# Patient Record
Sex: Female | Born: 1990 | Race: Black or African American | Hispanic: No | Marital: Single
Health system: Midwestern US, Community
[De-identification: ages and names within clinical notes are randomized; demographics above are authoritative.]

---
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
## 2012-07-01 NOTE — ED Notes (Signed)
Pt denies wanting forensics now; states that she just wants condom removed and to go home
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
## 2012-07-01 NOTE — ED Notes (Signed)
Betsy from forensics coming to see pt
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
## 2014-05-12 ENCOUNTER — Inpatient Hospital Stay
Admit: 2014-05-12 | Discharge: 2014-05-12 | Disposition: A | Discharge status: Home / Self Care | Payer: Self-pay | Attending: Emergency Medicine

## 2014-05-12 DIAGNOSIS — S46911A Strain of unspecified muscle, fascia and tendon at shoulder and upper arm level, right arm, initial encounter: Secondary | ICD-10-CM

## 2014-05-12 MED ORDER — CYCLOBENZAPRINE 10 MG TAB
10 mg | ORAL_TABLET | Freq: Three times a day (TID) | ORAL | Status: DC | PRN
Start: 2014-05-12 — End: 2014-12-11
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
## 2014-05-12 NOTE — ED Notes (Signed)
Discharge instructions given to patient, patient verbalizes understanding of instructions. Patient alert and oriented x3, denies pain or shortness of breath at this time. Patient ambulatory, gait steady.   Patient armband removed and given to patient to take home.  Patient was informed of the privacy risks if armband lost or stolen.

## 2014-11-25 DIAGNOSIS — Z0441 Encounter for examination and observation following alleged adult rape: Secondary | ICD-10-CM
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
## 2014-11-25 NOTE — ED Notes (Signed)
[place] [employer] in, visiting with patient.
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
## 2014-11-26 ENCOUNTER — Inpatient Hospital Stay
Admit: 2014-11-26 | Discharge: 2014-11-26 | Discharge status: Against Medical Advice | Payer: Self-pay | Attending: Emergency Medicine

## 2014-11-26 MED ORDER — ACETAMINOPHEN 500 MG TAB
500 mg | ORAL | Status: AC
Start: 2014-11-26 — End: 2014-11-25
  Administered 2014-11-26: 04:00:00 via ORAL

## 2014-11-26 MED FILL — MAPAP EXTRA STRENGTH 500 MG TABLET: 500 mg | ORAL | Qty: 2

## 2014-11-26 NOTE — ED Notes (Signed)
Patient requested testing for STD's. However, pending work up for SANE to be process. Reminded patient, signed refusal for SANE kit processing. Corey Skains, PA. Discussed current patient. Patient refuses intervention. Discussed re: Against Medical Advice. Prepared refusal form, however patient left, without discharge instructions. Glendell Docker, [place] and Dr Chrisandra Carota aware.

## 2014-12-11 ENCOUNTER — Inpatient Hospital Stay
Admit: 2014-12-11 | Discharge: 2014-12-11 | Disposition: A | Discharge status: Home / Self Care | Payer: Self-pay | Attending: Emergency Medicine

## 2014-12-11 ENCOUNTER — Emergency Department: Admit: 2014-12-11 | Payer: Self-pay

## 2014-12-11 DIAGNOSIS — S60222A Contusion of left hand, initial encounter: Secondary | ICD-10-CM

## 2014-12-11 MED ORDER — HYDROCODONE-ACETAMINOPHEN 5 MG-325 MG TAB
5-325 mg | ORAL_TABLET | ORAL | 0 refills | Status: DC
Start: 2014-12-11 — End: 2014-12-12

## 2014-12-11 MED ORDER — IBUPROFEN 800 MG TAB
800 mg | ORAL_TABLET | Freq: Three times a day (TID) | ORAL | 0 refills | Status: AC
Start: 2014-12-11 — End: 2014-12-16

## 2014-12-11 NOTE — ED Triage Notes (Signed)
Pt states she fell in the [employer] parking lot.  Pt reports left hand pain

## 2014-12-11 NOTE — ED Notes (Signed)
I have reviewed discharge instructions with the patient.  The patient verbalized understanding. Patient armband removed and shredded. Patient discharged ambulatory to lobby with friend.
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
## 2014-12-12 ENCOUNTER — Emergency Department: Admit: 2014-12-13 | Payer: Self-pay

## 2014-12-12 ENCOUNTER — Inpatient Hospital Stay
Admit: 2014-12-12 | Discharge: 2014-12-13 | Disposition: A | Discharge status: Home / Self Care | Payer: Self-pay | Attending: Emergency Medicine

## 2014-12-12 DIAGNOSIS — S40012A Contusion of left shoulder, initial encounter: Secondary | ICD-10-CM

## 2014-12-12 NOTE — ED Notes (Signed)
I have reviewed discharge instructions with the patient.  The patient verbalized understanding.

## 2014-12-12 NOTE — ED Provider Notes (Signed)
HPI Comments: 7:55 PM Sharon Davidson is a 24 y.o. female with a history of HTN and stroke who presents to the ED with chief complaint of left ankle pain onset 2 days ago after she fell onto concrete. Patient reports difficulty ambulating secondary to pain. She also complains of pain to her right knee and left shoulder. Patient is allergic to sulfa drugs. She denies LOC, head injury, neck pain or possibility of pregnancy. Tetanus not UTD. No other concerns at this time.          The history is provided by the patient.        Past Medical History:   Diagnosis Date   ??? Hypertension    ??? Stroke [facility])        History reviewed. No pertinent past surgical history.      History reviewed. No pertinent family history.    Social History     Social History   ??? Marital status: SINGLE     Spouse name: N/A   ??? Number of children: N/A   ??? Years of education: N/A     Occupational History   ??? Not on file.     Social History Main Topics   ??? Smoking status: Current Every Day Smoker   ??? Smokeless tobacco: Not on file   ??? Alcohol use Yes      Comment: oocasionally   ??? Drug use: Yes     Special: Marijuana      Comment: x2 weekly   ??? Sexual activity: Not on file     Other Topics Concern   ??? Not on file     Social History Narrative         ALLERGIES: Sulfa (sulfonamide antibiotics)    Review of Systems   Constitutional: Negative for chills and fever.   HENT: Positive for congestion. Negative for sore throat.    Eyes: Negative for redness.   Respiratory: Negative for shortness of breath and wheezing.    Gastrointestinal: Negative for abdominal pain and nausea.   Genitourinary: Negative for dysuria.   Musculoskeletal: Positive for arthralgias (left ankle; right knee; left shoulder). Negative for neck stiffness.   Skin: Negative for pallor.   Neurological: Negative for syncope and headaches.   Hematological: Does not bruise/bleed easily.   All other systems reviewed and are negative.      Vitals:    12/12/14 1950   BP: 151/83   Pulse: 61    Resp: 18   Temp: 98.6 ??F (37 ??C)   SpO2: 100%            Physical Exam   Constitutional: She is oriented to person, place, and time. She appears well-developed and well-nourished. No distress.   HENT:   Head: Normocephalic and atraumatic.   Mouth/Throat: Oropharynx is clear and moist.   Eyes: Conjunctivae are normal. Pupils are equal, round, and reactive to light. No scleral icterus.   Neck: Normal range of motion. Neck supple.   Cardiovascular: Intact distal pulses.    Pulses:       Dorsalis pedis pulses are 2+ on the right side, and 2+ on the left side.   Capillary refill < 3 seconds   Pulmonary/Chest: Effort normal and breath sounds normal. No respiratory distress. She has no wheezes.   Abdominal: Soft. Bowel sounds are normal. She exhibits no distension. There is no tenderness.   Musculoskeletal: Normal range of motion. She exhibits no edema.   Lateral tenderness to the dorsum of the left  foot, close to the ankle  No knee, tibia, or fibula tenderness on the left  Limping on left foot  No right knee tenderness  No swelling or redness of lower extremity   Lymphadenopathy:     She has no cervical adenopathy.   Neurological: She is alert and oriented to person, place, and time. No cranial nerve deficit.   Skin: Skin is warm and dry. Abrasion (right knee) noted. She is not diaphoretic.   Nursing note and vitals reviewed.       MDM  Number of Diagnoses or Management Options  Abrasion:   Contusion of left shoulder, initial encounter:   Foot sprain, left, initial encounter:   Diagnosis management comments: ddx fx, contusion, sprain, dislocation    Xrays, analgesia, tdap    Pre and post ace wrap done by RN: nv intact       Amount and/or Complexity of Data Reviewed  Tests in the radiology section of CPT??: ordered and reviewed  Tests in the medicine section of CPT??: ordered and reviewed    Patient Progress  Patient progress: stable    ED Course       Procedures    Vitals:  Patient Vitals for the past 12 hrs:    Temp Pulse Resp BP SpO2   12/12/14 1950 98.6 ??F (37 ??C) 61 18 151/83 100 %       Lab findings:  Recent Results (from the past 12 hour(s))   HCG URINE, QL. - POC    Collection Time: 12/12/14  8:40 PM   Result Value Ref Range    Pregnancy test,urine (POC) NEGATIVE  NEG         X-Ray, CT or other radiology findings or impressions:  XR SHOULDER LT AP/LAT MIN 2 V   Preliminary Result  No fracture or dislocation interpreted by Ewing SchleinAdrian C Almetta Liddicoat, DO at 9:07 PM    XR FOOT LT MIN 3 V    Preliminary Result   No fracture or dislocation interpreted by Ewing SchleinAdrian C Lamarius Dirr, DO at 9:07 PM     Progress notes, Consult notes or additional Procedure notes:   9:08 PM Reassessed Pt. Reviewed all results with pt and pt agrees with plan for discharge and follow up. All questions answered at this time. Pt voices understanding.    Disposition:  Diagnosis:   1. Contusion of left shoulder, initial encounter    2. Foot sprain, left, initial encounter    3. Abrasion        Disposition: Discharged    Follow-up Information     Follow up With Details Comments Contact Info    [facility] EMERGENCY DEPT Go to As needed, If symptoms worsen [address]  [place] [id number]  [phone]    Kimberlee NearingPaul Warren, MD Schedule an appointment as soon as possible for a visit in 2 days ED visit follow up [address]  [address]  [facility]  [place] [phone]  [phone]      [facility] Schedule an appointment as soon as possible for a visit in 2 days ED visit follow up [address]  [place] [phone]  [phone]    [facility] Call in 1 day ED visit follow up [address]  [place] [phone]  [phone]           Patient's Medications   Start Taking    ACETAMINOPHEN-CODEINE (TYLENOL-CODEINE #3) 300-30 MG PER TABLET    Take  1 Tab by mouth every four (4) hours as needed for Pain. Max Daily Amount: 6 Tabs.   Continue Taking     IBUPROFEN (MOTRIN) 800 MG TABLET    Take 1 Tab by mouth every eight (8) hours for 5 days.   These Medications have changed    No medications on file   Stop Taking    HYDROCODONE-ACETAMINOPHEN (NORCO) 5-325 MG PER TABLET    Take 1-2 tablets PO every 4-6 hours as needed for pain control.  If over the counter ibuprofen or acetaminophen was suggested, then only take the vicodin for pain not well controlled with the over the counter medication.         Scribe Attestation  Regina Eck scribing for and in the presence of Ewing Schlein, DO (12/12/14 / 7:57 PM)      Physician Attestation    I personally performed the services described in this documentation, reviewed and edited the documentation which was dictated to the scribe in my presence, and it accurately records my words and actions.    Ewing Schlein, DO
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
## 2014-12-13 LAB — HCG URINE, QL. - POC: Pregnancy test,urine (POC): NEGATIVE

## 2014-12-13 MED ORDER — DIPHTH,PERTUS(AC)TETANUS VAC(PF) 2.5 LF UNIT-8 MCG-5 LF/0.5 ML INJ
INTRAMUSCULAR | Status: AC
Start: 2014-12-13 — End: 2014-12-12
  Administered 2014-12-13: 02:00:00 via INTRAMUSCULAR

## 2014-12-13 MED ORDER — ACETAMINOPHEN-CODEINE 300 MG-30 MG TAB
300-30 mg | ORAL_TABLET | ORAL | 0 refills | Status: DC | PRN
Start: 2014-12-13 — End: 2015-05-12

## 2014-12-13 MED ORDER — ACETAMINOPHEN-CODEINE 300 MG-30 MG TAB
300-30 mg | ORAL | Status: AC
Start: 2014-12-13 — End: 2014-12-12
  Administered 2014-12-13: 01:00:00 via ORAL

## 2014-12-13 MED FILL — BOOSTRIX TDAP 2.5 LF UNIT-8 MCG-5 LF/0.5 ML INTRAMUSCULAR SYRINGE: INTRAMUSCULAR | Qty: 0.5

## 2014-12-13 MED FILL — ACETAMINOPHEN-CODEINE 300 MG-30 MG TAB: 300-30 mg | ORAL | Qty: 2

## 2014-12-13 MED FILL — BOOSTRIX TDAP 2.5 LF UNIT-8 MCG-5 LF/0.5 ML INTRAMUSCULAR SYRINGE: INTRAMUSCULAR | Qty: 1

## 2015-05-12 DIAGNOSIS — K047 Periapical abscess without sinus: Secondary | ICD-10-CM

## 2015-05-12 NOTE — ED Triage Notes (Signed)
Pt reports R sided dental and facial pain x 1 week.

## 2015-05-12 NOTE — ED Notes (Signed)
I have reviewed discharge instructions and medications with the patient. The patient verbalized understanding.Patient is stable and in good condition with normal vital signs. Patient chose to discharge with their wristband; privacy risks were discussed. Patient escorted to lobby.

## 2015-05-12 NOTE — ED Provider Notes (Signed)
HPI Comments: 7:57 PM Sharon Davidson is a 25 y.o. female with h/o HTN and stroke who presents to ED complaining of lower right dental pain. Pt states she is taking Ibuprofen for the pain. Pt denies Penicillin allergy. No other concerns or symptoms at this time.    PCP: None    The history is provided by the patient.        Past Medical History:   Diagnosis Date   ??? Hypertension    ??? Stroke [facility])        History reviewed. No pertinent past surgical history.      History reviewed. No pertinent family history.    Social History     Social History   ??? Marital status: SINGLE     Spouse name: N/A   ??? Number of children: N/A   ??? Years of education: N/A     Occupational History   ??? Not on file.     Social History Main Topics   ??? Smoking status: Current Every Day Smoker   ??? Smokeless tobacco: Not on file   ??? Alcohol use Yes      Comment: oocasionally   ??? Drug use: Yes     Special: Marijuana      Comment: x2 weekly   ??? Sexual activity: Not on file     Other Topics Concern   ??? Not on file     Social History Narrative         ALLERGIES: Sulfa (sulfonamide antibiotics)    Review of Systems   Constitutional: Negative for chills and fever.   HENT: Positive for dental problem. Negative for sore throat.    Respiratory: Negative for shortness of breath.    Cardiovascular: Negative for chest pain.   Gastrointestinal: Negative for diarrhea and vomiting.   Skin: Negative for rash.   Neurological: Negative for headaches.   All other systems reviewed and are negative.      Vitals:    05/12/15 1958   BP: 117/54   Pulse: 66   Resp: 16   Temp: 98.3 ??F (36.8 ??C)   SpO2: 100%            Physical Exam   Constitutional: She is oriented to person, place, and time. She appears well-developed and well-nourished. No distress.   HENT:   Head: Normocephalic and atraumatic.   Deep decay to tooth # 31 w/ abscess at base of tooth.   Eyes: No scleral icterus.   Cardiovascular: Normal rate.    Pulmonary/Chest: Effort normal.    Neurological: She is alert and oriented to person, place, and time.   Skin: Skin is warm and dry.   Psychiatric: She has a normal mood and affect.   Nursing note and vitals reviewed.       MDM  Number of Diagnoses or Management Options  Dental abscess:   Dental decay:   Diagnosis management comments: IMP: Dental decay/abscess    ED Course       Procedures    Vitals:  Patient Vitals for the past 12 hrs:   Temp Pulse Resp BP SpO2   05/12/15 1958 98.3 ??F (36.8 ??C) 66 16 117/54 100 %         Medications ordered:   Medications - No data to display      Lab findings:  No results found for this or any previous visit (from the past 12 hour(s)).      Orders:  Orders Placed This Encounter   ???  penicillin v potassium (VEETID) 500 mg tablet     Sig: Take 1 Tab by mouth three (3) times daily for 10 days.     Dispense:  30 Tab     Refill:  0   ??? HYDROcodone-acetaminophen (NORCO) 5-325 mg per tablet     Sig: Take 1 Tab by mouth every four (4) hours as needed for Pain. Max Daily Amount: 6 Tabs.     Dispense:  12 Tab     Refill:  0   ??? ibuprofen (MOTRIN) 600 mg tablet     Sig: Take 1 Tab by mouth every six (6) hours as needed for Pain.     Dispense:  20 Tab     Refill:  0       Progress notes, Consult notes, or additional Procedure notes:   8:14 PM I have reassessed the patient and discussed their results and diagnosis. Pt will be discharged in stable condition. Patient is to return to emergency department if any new or worsening condition. Patient understands and verbalizes agreement with plan.    Disposition:  Diagnosis:   1. Dental abscess    2. Dental [facility] [phone]  [facility] [phone]  [facility] [phone]  [facility] [phone]  [facility] [phone]    1) Penicillin  2) Ibuprofen  3) Norco for severe pain  4) Salt water gargles  5) Dental follow up ASAP  6) Return for medical emergencies as needed      Disposition: Discharged home    Follow-up Information      Follow up With Details Comments Contact Info    Follow up with one of the dental clinics provided Schedule an appointment as soon as possible for a visit  [facility] [phone]  [facility] [phone]  [facility] [phone]  [facility] [phone]  [facility] [phone]  [facility] [phone]    [facility] EMERGENCY DEPT Go to As needed, If symptoms worsen [address]  [place] [address]  [phone]           Patient's Medications   Start Taking    HYDROCODONE-ACETAMINOPHEN (NORCO) 5-325 MG PER TABLET    Take 1 Tab by mouth every four (4) hours as needed for Pain. Max Daily Amount: 6 Tabs.    IBUPROFEN (MOTRIN) 600 MG TABLET    Take 1 Tab by mouth every six (6) hours as needed for Pain.    PENICILLIN V POTASSIUM (VEETID) 500 MG TABLET    Take 1 Tab by mouth three (3) times daily for 10 days.   Continue Taking    No medications on file   These Medications have changed    No medications on file   Stop Taking    ACETAMINOPHEN-CODEINE (TYLENOL-CODEINE #3) 300-30 MG PER TABLET    Take 1 Tab by mouth every four (4) hours as needed for Pain. Max Daily Amount: 6 Tabs.       Scribe Attestation  [employer] scribing for and in the presence of Karma Ganja, MD (05/12/15)      Physician Attestation  I personally performed the services described in this documentation, reviewed and edited the documentation which was dictated to the scribe in my presence, and it accurately records my words and actions.    Karma Ganja, MD (05/12/15)      Signed by: Dossie Arbour, Scribe, 05/12/15, 7:58 PM

## 2015-05-13 ENCOUNTER — Inpatient Hospital Stay
Admit: 2015-05-13 | Discharge: 2015-05-13 | Disposition: A | Discharge status: Home / Self Care | Payer: Self-pay | Attending: Emergency Medicine

## 2015-05-13 MED ORDER — IBUPROFEN 600 MG TAB
600 mg | ORAL_TABLET | Freq: Four times a day (QID) | ORAL | 0 refills | Status: DC | PRN
Start: 2015-05-13 — End: 2017-04-17

## 2015-05-13 MED ORDER — HYDROCODONE-ACETAMINOPHEN 5 MG-325 MG TAB
5-325 mg | ORAL_TABLET | ORAL | 0 refills | Status: DC | PRN
Start: 2015-05-13 — End: 2017-04-17

## 2015-05-13 MED ORDER — PENICILLIN V-K 500 MG TAB
500 mg | ORAL_TABLET | Freq: Three times a day (TID) | ORAL | 0 refills | Status: AC
Start: 2015-05-13 — End: 2015-05-22

## 2017-04-17 ENCOUNTER — Inpatient Hospital Stay
Admit: 2017-04-17 | Discharge: 2017-04-17 | Disposition: A | Discharge status: Home / Self Care | Payer: Self-pay | Attending: Emergency Medicine

## 2017-04-17 DIAGNOSIS — K029 Dental caries, unspecified: Secondary | ICD-10-CM

## 2017-04-17 MED ORDER — CLINDAMYCIN 300 MG CAP
300 mg | ORAL_CAPSULE | Freq: Three times a day (TID) | ORAL | 0 refills | Status: AC
Start: 2017-04-17 — End: 2017-04-24

## 2017-04-17 MED ORDER — HYDROCODONE-ACETAMINOPHEN 5 MG-325 MG TAB
5-325 mg | ORAL_TABLET | Freq: Four times a day (QID) | ORAL | 0 refills | Status: DC | PRN
Start: 2017-04-17 — End: 2020-12-22

## 2017-04-17 MED ORDER — CHLORHEXIDINE GLUCONATE 0.12 % MOUTHWASH
0.12 % | Freq: Two times a day (BID) | 0 refills | Status: AC
Start: 2017-04-17 — End: 2017-05-01

## 2017-04-17 MED ORDER — IBUPROFEN 600 MG TAB
600 mg | ORAL_TABLET | Freq: Four times a day (QID) | ORAL | 0 refills | Status: DC | PRN
Start: 2017-04-17 — End: 2020-12-22
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
## 2017-04-17 NOTE — ED Notes (Signed)
Patient stated understanding of discharge instructions. Patient received four prescription(s) Patient told not to drive with medication. Patient was ambulatory upon discharge. Patient was in stable condition.     Patient armband removed and shredded

## 2017-04-17 NOTE — ED Provider Notes (Signed)
3:55 PM   26 y.o. female presents to ED C/O dental pain.  Patient reports worsening bilateral lower back dental pain x 1 month, patient reports the pain has gotten worse since onset, OTC medication not helping at home.  Patient reports she had a boil on right lower jaw area, she has busted it twice and both times yellow drainage noted. Patient reports she has an appt on 1/[facility] at [facility] appt.  LMP12/28 (denies chance of pregnancy.).  Patient smokes 1/4ppd.   Patient denies any other symptoms or complaints.               Past Medical History:   Diagnosis Date   ??? Hypertension    ??? Stroke [facility](HCC)        History reviewed. No pertinent surgical history.      History reviewed. No pertinent family history.    Social History     Socioeconomic History   ??? Marital status: SINGLE     Spouse name: Not on file   ??? Number of children: Not on file   ??? Years of education: Not on file   ??? Highest education level: Not on file   Social Needs   ??? Financial resource strain: Not on file   ??? Food insecurity - worry: Not on file   ??? Food insecurity - inability: Not on file   ??? Transportation needs - medical: Not on file   ??? Transportation needs - non-medical: Not on file   Occupational History   ??? Not on file   Tobacco Use   ??? Smoking status: Current Every Day Smoker   Substance and Sexual Activity   ??? Alcohol use: Yes     Comment: oocasionally   ??? Drug use: Yes     Types: Marijuana     Comment: x2 weekly   ??? Sexual activity: Not on file   Other Topics Concern   ??? Not on file   Social History Narrative   ??? Not on file         ALLERGIES: Sulfa (sulfonamide antibiotics)    Review of Systems   Constitutional: Negative for appetite change and fever.   HENT: Positive for dental problem. Negative for congestion, rhinorrhea and sore throat.    Respiratory: Negative for cough, shortness of breath and wheezing.    Cardiovascular: Negative for chest pain and leg swelling.    Gastrointestinal: Negative for abdominal pain, constipation, diarrhea, nausea and vomiting.   Genitourinary: Negative for dysuria.   Musculoskeletal: Negative for arthralgias and back pain.   Neurological: Negative for dizziness, syncope and headaches.   All other systems reviewed and are negative.      Vitals:    04/17/17 1557 04/17/17 1621   BP: 147/83    Pulse: 96    Resp: 16 16   Temp: 99.3 ??F (37.4 ??C)    SpO2: 97%    Weight: 95.3 kg (210 lb)    Height: 5' (1.524 m)             Physical Exam   Constitutional: She is oriented to person, place, and time. She appears well-developed and well-nourished.   Patient appears uncomfortable    HENT:   Head: Atraumatic.   Right Ear: Hearing, tympanic membrane, external ear and ear canal normal.   Left Ear: Hearing, tympanic membrane, external ear and ear canal normal.   Nose: Nose normal.   Mouth/Throat: No trismus in the jaw.  No facial swelling noted    Neck: Neck supple.   Cardiovascular: Normal rate, regular rhythm and intact distal pulses.   Pulmonary/Chest: Effort normal and breath sounds normal. No stridor. No respiratory distress. She has no wheezes. She has no rales.   Musculoskeletal: Normal range of motion.   Lymphadenopathy:     She has no cervical adenopathy.   Neurological: She is alert and oriented to person, place, and time. No cranial nerve deficit. Coordination normal.   Skin: Skin is warm and dry. No erythema.   Nursing note and vitals reviewed.       MDM  Number of Diagnoses or Management Options  Dental caries:   Pain, dental:   Diagnosis management comments: MDM:  Apparent infection right lower jaw, patient reports recurrent abscesses though none visualized at this time.  Patient started on antibiotics for infection and referral to dental clinics.  Short course of pain medication provided.  Patient has no trismus, fever, or evidence of ludwigs, she is appropriate for discharge home at this time.          Procedures                 RESULTS:     No orders to display       Labs Reviewed - No data to display    No results found for this or any previous visit (from the past 12 hour(s)).    PROGRESS NOTE:   3:55 PM   Initial assessment completed.  Written by Gerome SamSarah Montay Vanvoorhis NP-C    DISCHARGE NOTE:  4:05 PM   Bobby Rumpfstinnea Riedesel's  results have been reviewed with her.  She has been counseled regarding her diagnosis, treatment, and plan.  She verbally conveys understanding and agreement of the signs, symptoms, diagnosis, treatment and prognosis and additionally agrees to follow up as discussed.  She also agrees with the care-plan and conveys that all of her questions have been answered.  I have also provided discharge instructions for her that include: educational information regarding their diagnosis and treatment, and list of reasons why they would want to return to the ED prior to their follow-up appointment, should her condition change.     CLINICAL IMPRESSION:    1. Dental caries    2. Pain, dental        AFTER VISIT PLAN:    Discharge Medication List as of 04/17/2017  4:06 PM      START taking these medications    Details   ibuprofen (MOTRIN) 600 mg tablet Take 1 Tab by mouth every six (6) hours as needed for Pain., Print, Disp-20 Tab, R-0      HYDROcodone-acetaminophen (NORCO) 5-325 mg per tablet Take 1 Tab by mouth every six (6) hours as needed for Pain. Max Daily Amount: 4 Tabs., Print, Disp-10 Tab, R-0      clindamycin (CLEOCIN) 300 mg capsule Take 1 Cap by mouth three (3) times daily for 7 days., Print, Disp-21 Cap, R-0      chlorhexidine (PERIDEX) 0.12 % solution 15 mL by Swish and Spit route two (2) times a day for 14 days., Print, Disp-420 mL, R-0              Follow-up Information     Follow up With Specialties Details Why Contact Info    [facility]  Schedule an appointment as soon as possible for a visit in 3 days Further evaluation [id number]  [place] [id number]  [phone]  Written by Catha Brow NP-C

## 2019-12-27 ENCOUNTER — Emergency Department: Admit: 2019-12-28

## 2019-12-27 DIAGNOSIS — R55 Syncope and collapse: Secondary | ICD-10-CM

## 2019-12-27 NOTE — ED Notes (Signed)
Pt sts she is still unable to provide a urine specimen;  Po water given to pt;  Call bell within reach;  Friend at bedside

## 2019-12-27 NOTE — ED Notes (Signed)
Pt sts she is unable to urinate at this time

## 2019-12-27 NOTE — ED Notes (Signed)
Pt c/o H/A;  Provider aware and pt medicated per order;  Pt in NAD;  VSS;  Pt awaiting CT and still remains unable to provide a urine specimen

## 2019-12-27 NOTE — ED Notes (Signed)
Patient arrives to ED with c/c of a syncopal episode that occurred at [facility], onset 1800 this evening. She reports she doesn't remember anything that occurred. She states she thinks she got dizzy and then fell hitting the back of her head and left side of her shoulder. She states she has had strokes in the past and she states this week she has been having some increased dizziness with some vision changes.

## 2019-12-27 NOTE — ED Notes (Signed)
EKG completed, IV initiated with blood draw;  bld sent to lab;  Pt in NAD awaiting for mother as visitor;  SR up x's 2 for safety;  Call bell within reach;  Pt denies any dizziness at this time;  Warm blankets provided for comfort; light dimmed per pt request; VSS

## 2019-12-28 ENCOUNTER — Inpatient Hospital Stay
Admit: 2019-12-28 | Discharge: 2019-12-28 | Disposition: A | Discharge status: Home / Self Care | Attending: Emergency Medicine

## 2019-12-28 LAB — METABOLIC PANEL, COMPREHENSIVE
A-G Ratio: 0.9 (ref 0.8–1.7)
ALT (SGPT): 35 U/L (ref 13–56)
AST (SGOT): 26 U/L (ref 10–38)
Albumin: 3.8 g/dL (ref 3.4–5.0)
Alk. phosphatase: 68 U/L (ref 45–117)
Anion gap: 9 mmol/L (ref 3.0–18)
BUN/Creatinine ratio: 19 (ref 12–20)
BUN: 18 MG/DL (ref 7.0–18)
Bilirubin, total: 0.3 MG/DL (ref 0.2–1.0)
CO2: 25 mmol/L (ref 21–32)
Calcium: 9.3 MG/DL (ref 8.5–10.1)
Chloride: 105 mmol/L (ref 100–111)
Creatinine: 0.93 MG/DL (ref 0.6–1.3)
GFR est AA: >60 mL/min/{1.73_m2} (ref >60)
GFR est non-AA: >60 mL/min/{1.73_m2} (ref >60)
Globulin: 4.2 g/dL — ABNORMAL HIGH (ref 2.0–4.0)
Glucose: 94 mg/dL (ref 74–99)
Potassium: 3.9 mmol/L (ref 3.5–5.5)
Protein, total: 8 g/dL (ref 6.4–8.2)
Sodium: 139 mmol/L (ref 136–145)

## 2019-12-28 LAB — CBC WITH AUTOMATED DIFF
ABS. BASOPHILS: 0 10*3/uL (ref 0.0–0.1)
ABS. EOSINOPHILS: 0.4 10*3/uL (ref 0.0–0.4)
ABS. LYMPHOCYTES: 2.8 10*3/uL (ref 0.9–3.6)
ABS. MONOCYTES: 0.7 10*3/uL (ref 0.05–1.2)
ABS. NEUTROPHILS: 6.2 10*3/uL (ref 1.8–8.0)
BASOPHILS: 0 % (ref 0–2)
EOSINOPHILS: 4 % (ref 0–5)
HCT: 35.9 % (ref 35.0–45.0)
HGB: 11.5 g/dL — ABNORMAL LOW (ref 12.0–16.0)
LYMPHOCYTES: 27 % (ref 21–52)
MCH: 27.2 PG (ref 24.0–34.0)
MCHC: 32 g/dL (ref 31.0–37.0)
MCV: 84.9 FL (ref 78.0–100.0)
MONOCYTES: 7 % (ref 3–10)
MPV: 10.6 FL (ref 9.2–11.8)
NEUTROPHILS: 61 % (ref 40–73)
PLATELET: 254 10*3/uL (ref 135–420)
RBC: 4.23 M/uL (ref 4.20–5.30)
RDW: 14.6 % — ABNORMAL HIGH (ref 11.6–14.5)
WBC: 10.1 10*3/uL (ref 4.6–13.2)

## 2019-12-28 LAB — CARDIAC PANEL,(CK, CKMB & TROPONIN)
CK - MB: <1 ng/ml (ref <3.6)
CK: 305 U/L — ABNORMAL HIGH (ref 26–192)
Troponin-I, QT: <0.02 NG/ML (ref 0.0–0.045)

## 2019-12-28 LAB — HCG QL SERUM
HCG, Ql.: NEGATIVE
HCG, Ql.: NEGATIVE

## 2019-12-28 LAB — MAGNESIUM
Magnesium: 2.4 mg/dL (ref 1.6–2.6)
Magnesium: 2.4 mg/dL (ref 1.6–2.6)

## 2019-12-28 LAB — CBC WITH AUTO DIFFERENTIAL
Basophils %: 0 % (ref 0–2)
Basophils Absolute: 0 10*3/uL (ref 0.0–0.1)
Eosinophils %: 4 % (ref 0–5)
Eosinophils Absolute: 0.4 10*3/uL (ref 0.0–0.4)
Hematocrit: 35.9 % (ref 35.0–45.0)
Hemoglobin: 11.5 g/dL — ABNORMAL LOW (ref 12.0–16.0)
Lymphocytes %: 27 % (ref 21–52)
Lymphocytes Absolute: 2.8 10*3/uL (ref 0.9–3.6)
MCH: 27.2 PG (ref 24.0–34.0)
MCHC: 32 g/dL (ref 31.0–37.0)
MCV: 84.9 FL (ref 78.0–100.0)
MPV: 10.6 FL (ref 9.2–11.8)
Monocytes %: 7 % (ref 3–10)
Monocytes Absolute: 0.7 10*3/uL (ref 0.05–1.2)
Neutrophils %: 61 % (ref 40–73)
Neutrophils Absolute: 6.2 10*3/uL (ref 1.8–8.0)
Platelets: 254 10*3/uL (ref 135–420)
RBC: 4.23 M/uL (ref 4.20–5.30)
RDW: 14.6 % — ABNORMAL HIGH (ref 11.6–14.5)
WBC: 10.1 10*3/uL (ref 4.6–13.2)

## 2019-12-28 LAB — COMPREHENSIVE METABOLIC PANEL
ALT: 35 U/L (ref 13–56)
AST: 26 U/L (ref 10–38)
Albumin/Globulin Ratio: 0.9 (ref 0.8–1.7)
Albumin: 3.8 g/dL (ref 3.4–5.0)
Alkaline Phosphatase: 68 U/L (ref 45–117)
Anion Gap: 9 mmol/L (ref 3.0–18)
BUN: 18 MG/DL (ref 7.0–18)
Bun/Cre Ratio: 19 (ref 12–20)
CO2: 25 mmol/L (ref 21–32)
Calcium: 9.3 MG/DL (ref 8.5–10.1)
Chloride: 105 mmol/L (ref 100–111)
Creatinine: 0.93 MG/DL (ref 0.6–1.3)
EGFR IF NonAfrican American: >60 mL/min/{1.73_m2} (ref >60)
GFR African American: >60 mL/min/{1.73_m2} (ref >60)
Globulin: 4.2 g/dL — ABNORMAL HIGH (ref 2.0–4.0)
Glucose: 94 mg/dL (ref 74–99)
Potassium: 3.9 mmol/L (ref 3.5–5.5)
Sodium: 139 mmol/L (ref 136–145)
Total Bilirubin: 0.3 MG/DL (ref 0.2–1.0)
Total Protein: 8 g/dL (ref 6.4–8.2)

## 2019-12-28 LAB — CARDIAC PANEL
CK-MB: <1 ng/ml (ref <3.6)
Total CK: 305 U/L — ABNORMAL HIGH (ref 26–192)
Troponin I: <0.02 NG/ML (ref 0.0–0.045)

## 2019-12-28 MED ORDER — METHOCARBAMOL 500 MG TAB
500 mg | ORAL_TABLET | Freq: Three times a day (TID) | ORAL | 0 refills | Status: DC
Start: 2019-12-28 — End: 2020-12-22

## 2019-12-28 MED ORDER — ACETAMINOPHEN 325 MG TABLET
325 mg | ORAL | Status: AC
Start: 2019-12-28 — End: 2019-12-27
  Administered 2019-12-28: 04:00:00 via ORAL

## 2019-12-28 MED ORDER — DIPHENHYDRAMINE 25 MG CAP
25 mg | ORAL | Status: AC
Start: 2019-12-28 — End: 2019-12-27
  Administered 2019-12-28: 04:00:00 via ORAL

## 2019-12-28 MED FILL — DIPHENHYDRAMINE 25 MG CAP: 25 mg | ORAL | Qty: 2

## 2019-12-28 MED FILL — ACETAMINOPHEN 325 MG TABLET: 325 mg | ORAL | Qty: 2

## 2019-12-28 NOTE — ED Provider Notes (Signed)
EMERGENCY DEPARTMENT HISTORY AND PHYSICAL EXAM    Date: 12/27/2019  Patient Name: Sharon Davidson    History of Presenting Illness     Chief Complaint   Patient presents with   ??? Fall   ??? Syncope         History Provided By: Patient    Additional History (Context):   10:35 PM  Sharon Davidson is a 29 y.o. female with PMHX of "hypertension and previous strokes" according to the patient and chart who presents to the emergency department C/O syncopal episode with head injury.  Patient reports she had a syncopal episode while in [employer] earlier this evening.  She denies any preceding chest pain shortness of breath belly pain but reports she was dizzy without vertigo about 6 PM passing out being found on the floor.  She awoke with headache moderate in severity without aggravating or alleviating symptoms and some pain in the shoulder region, mild in severity and aggravated with touch or movement..  She has been feeling well over the weekend and has not had any symptoms of illness.  She reports being admitted to the hospital for previous strokes before in the past and has a diagnosis of hypertension but does not take any medications or blood thinners.  She denies any noncompliance with medications and any that her doctors have not prescribed her medications.    Social History  She acknowledges smoking cigarettes denies use or illegal street drugs.    Family History  Denies family history of premature heart disease strokes    PCP: None    Current Outpatient Medications   Medication Sig Dispense Refill   ??? methocarbamoL (Robaxin) 500 mg tablet Take 2 Tablets by mouth three (3) times daily. 30 Tablet 0   ??? ibuprofen (MOTRIN) 600 mg tablet Take 1 Tab by mouth every six (6) hours as needed for Pain. 20 Tab 0   ??? HYDROcodone-acetaminophen (NORCO) 5-325 mg per tablet Take 1 Tab by mouth every six (6) hours as needed for Pain. Max Daily Amount: 4 Tabs. 10 Tab 0       Past History     Past Medical History:  Past Medical History:    Diagnosis Date   ??? Hypertension    ??? Stroke [facility])        Past Surgical History:  History reviewed. No pertinent surgical history.    Family History:  History reviewed. No pertinent family history.    Social History:  Social History     Tobacco Use   ??? Smoking status: Current Every Day Smoker   ??? Smokeless tobacco: Never Used   ??? Tobacco comment: socially   Substance Use Topics   ??? Alcohol use: Yes     Comment: oocasionally   ??? Drug use: Yes     Types: Marijuana     Comment: x2 weekly       Allergies:  Allergies   Allergen Reactions   ??? Sulfa (Sulfonamide Antibiotics) Anaphylaxis         Review of Systems   Review of Systems   Musculoskeletal: Positive for arthralgias.   Neurological: Positive for dizziness and headaches.   All other systems reviewed and are negative.    Physical Exam     Vitals:    12/27/19 2244 12/27/19 2251 12/27/19 2319 12/27/19 2326   BP:       Pulse: 70 76 63 71   Resp: '21 17 22 19   '$ Temp:  SpO2: 99% 99% 98% 97%   Weight:       Height:         Physical Exam  Vitals and nursing note reviewed.   Constitutional:       General: She is not in acute distress.     Appearance: She is well-developed. She is obese. She is not ill-appearing, toxic-appearing or diaphoretic.   HENT:      Head: Normocephalic and atraumatic.   Eyes:      General: No scleral icterus.     Extraocular Movements:      Right eye: Normal extraocular motion.      Left eye: Normal extraocular motion.      Conjunctiva/sclera: Conjunctivae normal.      Pupils: Pupils are equal, round, and reactive to light.   Neck:      Trachea: No tracheal deviation.   Cardiovascular:      Rate and Rhythm: Normal rate and regular rhythm.      Pulses:           Radial pulses are 2+ on the right side and 2+ on the left side.        Dorsalis pedis pulses are 2+ on the right side and 2+ on the left side.      Heart sounds: Normal heart sounds.   Pulmonary:      Effort: Pulmonary effort is normal. No respiratory distress.      Breath sounds: Normal  breath sounds. No stridor.   Abdominal:      General: Bowel sounds are normal. There is no distension.      Palpations: Abdomen is soft.      Tenderness: There is no abdominal tenderness. There is no rebound.   Musculoskeletal:         General: No tenderness. Normal range of motion.      Cervical back: Normal range of motion and neck supple.      Comments: Grossly unremarkable without abnormalities  Left shoulder has soft tissue tenderness but with full range of motion and no bony tenderness.  Bilateral distal radial median ulnar nerve function is normal.   Skin:     General: Skin is warm and dry.      Capillary Refill: Capillary refill takes less than 2 seconds.      Findings: No erythema or rash.   Neurological:      Mental Status: She is alert and oriented to person, place, and time.      GCS: GCS eye subscore is 4. GCS verbal subscore is 5. GCS motor subscore is 6.      Cranial Nerves: Cranial nerves are intact. No cranial nerve deficit or facial asymmetry.      Motor: No weakness.      Coordination: Coordination is intact.   Psychiatric:         Mood and Affect: Mood normal.         Behavior: Behavior normal.         Thought Content: Thought content normal.         Judgment: Judgment normal.       Diagnostic Study Results     Labs -  Recent Results (from the past 24 hour(s))   CBC WITH AUTOMATED DIFF    Collection Time: 12/27/19  9:00 PM   Result Value Ref Range    WBC 10.1 4.6 - 13.2 K/uL    RBC 4.23 4.20 - 5.30 M/uL    HGB 11.5 (L) 12.0 -  16.0 g/dL    HCT 35.9 35.0 - 45.0 %    MCV 84.9 78.0 - 100.0 FL    MCH 27.2 24.0 - 34.0 PG    MCHC 32.0 31.0 - 37.0 g/dL    RDW 14.6 (H) 11.6 - 14.5 %    PLATELET 254 135 - 420 K/uL    MPV 10.6 9.2 - 11.8 FL    NEUTROPHILS 61 40 - 73 %    LYMPHOCYTES 27 21 - 52 %    MONOCYTES 7 3 - 10 %    EOSINOPHILS 4 0 - 5 %    BASOPHILS 0 0 - 2 %    ABS. NEUTROPHILS 6.2 1.8 - 8.0 K/UL    ABS. LYMPHOCYTES 2.8 0.9 - 3.6 K/UL    ABS. MONOCYTES 0.7 0.05 - 1.2 K/UL    ABS. EOSINOPHILS 0.4 0.0  - 0.4 K/UL    ABS. BASOPHILS 0.0 0.0 - 0.1 K/UL    DF AUTOMATED     METABOLIC PANEL, COMPREHENSIVE    Collection Time: 12/27/19  9:00 PM   Result Value Ref Range    Sodium 139 136 - 145 mmol/L    Potassium 3.9 3.5 - 5.5 mmol/L    Chloride 105 100 - 111 mmol/L    CO2 25 21 - 32 mmol/L    Anion gap 9 3.0 - 18 mmol/L    Glucose 94 74 - 99 mg/dL    BUN 18 7.0 - 18 MG/DL    Creatinine 0.93 0.6 - 1.3 MG/DL    BUN/Creatinine ratio 19 12 - 20      GFR est AA >60 >60 ml/min/1.95m    GFR est non-AA >60 >60 ml/min/1.736m   Calcium 9.3 8.5 - 10.1 MG/DL    Bilirubin, total 0.3 0.2 - 1.0 MG/DL    ALT (SGPT) 35 13 - 56 U/L    AST (SGOT) 26 10 - 38 U/L    Alk. phosphatase 68 45 - 117 U/L    Protein, total 8.0 6.4 - 8.2 g/dL    Albumin 3.8 3.4 - 5.0 g/dL    Globulin 4.2 (H) 2.0 - 4.0 g/dL    A-G Ratio 0.9 0.8 - 1.7     CARDIAC PANEL,(CK, CKMB & TROPONIN)    Collection Time: 12/27/19  9:00 PM   Result Value Ref Range    CK - MB <1.0 <3.6 ng/ml    CK-MB Index  0.0 - 4.0 %     CALCULATION NOT PERFORMED WHEN RESULT IS BELOW LINEAR LIMIT    CK 305 (H) 26 - 192 U/L    Troponin-I, QT <0.02 0.0 - 0.045 NG/ML   MAGNESIUM    Collection Time: 12/27/19  9:00 PM   Result Value Ref Range    Magnesium 2.4 1.6 - 2.6 mg/dL   HCG QL SERUM    Collection Time: 12/27/19  9:00 PM   Result Value Ref Range    HCG, Ql. Negative NEG     EKG, 12 LEAD, INITIAL    Collection Time: 12/27/19  9:05 PM   Result Value Ref Range    Ventricular Rate 64 BPM    Atrial Rate 64 BPM    P-R Interval 136 ms    QRS Duration 88 ms    Q-T Interval 408 ms    QTC Calculation (Bezet) 420 ms    Calculated P Axis 36 degrees    Calculated R Axis 43 degrees    Calculated T Axis 37 degrees  Diagnosis       Sinus rhythm with premature atrial complexes  ST elevation, probably due to early repolarization  Borderline ECG  No previous ECGs available          Radiologic Studies -   CT HEAD WO CONT   Final Result      1. No acute intracranial process.        CT Results  (Last 48 hours)                12/28/19 0013  CT HEAD WO CONT Final result    Impression:      1. No acute intracranial process.       Narrative:  EXAM: CT head       INDICATION: Syncope with head injury.       COMPARISON: None.       TECHNIQUE: Axial CT imaging of the head was performed without intravenous   contrast. One or more dose reduction techniques were used on this CT: automated   exposure control, adjustment of the mAs and/or kVp according to patient size,   and iterative reconstruction techniques.  The specific techniques used on this   CT exam have been documented in the patient's electronic medical record.    Digital Imaging and Communications in Medicine (DICOM) format image data are   available to nonaffiliated external healthcare facilities or entities on a   secured, media free, reciprocally searchable basis with patient authorization   for at least a 42-monthperiod after this study.       _______________       FINDINGS:       BRAIN AND POSTERIOR FOSSA: There is no intracranial hemorrhage, mass effect, or   midline shift. There are no areas of abnormal parenchymal attenuation.       EXTRA-AXIAL SPACES AND MENINGES: There are no abnormal extra-axial fluid   collections.       CALVARIUM: Intact.       SINUSES: Clear.       OTHER: None.       _______________               CXR Results  (Last 48 hours)    None          Medications given in the ED-  Medications   diphenhydrAMINE (BENADRYL) capsule 50 mg (50 mg Oral Given 12/27/19 2345)   acetaminophen (TYLENOL) tablet 650 mg (650 mg Oral Given 12/27/19 2346)         Medical Decision Making   I am the first provider for this patient.    I reviewed the vital signs, available nursing notes, past medical history, past surgical history, family history and social history.    Vital Signs-Reviewed the patient's vital signs.    Pulse Oximetry Analysis -97% on room air    Cardiac Monitor:  Rate: 72 bpm  Rhythm: Sinus rhythm without irregularity or arrhythmias    EKG interpretation:  (Preliminary)  9:05 PM    Normal sinus rhythm with apparent noted single PAC, early repolarization, ST segments are normal, QTC is 420.  EKG read by JLuvenia Redden MD      Records Reviewed: NURSING NOTES AND [place] time to carefully review previous records of her reported stroke and hypertension.  CAT scans and MRIs never showed any evidence of occlusion bleed or mass lesions.  Additionally she had a couple recordings of elevated blood pressure but no significant diagnosis of hypertension or  prescription medications.  Additionally, a practitioner recommended antiplatelet therapy.    Provider Notes (Medical Decision Making):   Patient with syncopal episode brain scan showed no evidence of skull fracture or bleeding or lesion.  Secondly her old stroke history cannot be confirmed although she was admitted and observed for syncopal episode of possible stroke syndrome.  Seizure disorder is feasible but has never been confirmed.  She is not pregnant nor did we see electrolyte disorders metabolic problems low sugars renal disease.  There could be a psychogenic component.  We certainly must recognize her EKG has a subtle "hiccup" with PAC but otherwise unremarkable highly doubt this cardiac irregularity is responsible for having arrhythmia generated syncopal episodes.  There is no other indication for admission in the hospital.  Will discharge and recommend outpatient follow-up primary care and/or cardiology specifically the physicians he was seen admitted her and recommended outpatient follow-up in her past..    Procedures:  Procedures    ED Course:   10:35 PM : Initial assessment performed. The patients presenting problems have been discussed, and they are in agreement with the care plan formulated and outlined with them.  I have encouraged them to ask questions as they arise throughout their visit.    Diagnosis and Disposition       DISCHARGE NOTE:  1:34 AM  Sharon Davidson's  results  have been reviewed with her.  She has been counseled regarding her diagnosis, treatment, and plan.  She verbally conveys understanding and agreement of the signs, symptoms, diagnosis, treatment and prognosis and additionally agrees to follow up as discussed.  She also agrees with the care-plan and conveys that all of her questions have been answered.  I have also provided discharge instructions for her that include: educational information regarding their diagnosis and treatment, and list of reasons why they would want to return to the ED prior to their follow-up appointment, should her condition change. She has been provided with education for proper emergency department utilization.     CLINICAL IMPRESSION:    1. Vasovagal episode        PLAN:  1. D/C Home  2.   Discharge Medication List as of 12/28/2019  1:36 AM      START taking these medications    Details   methocarbamoL (Robaxin) 500 mg tablet Take 2 Tablets by mouth three (3) times daily., Print, Disp-30 Tablet, R-0         CONTINUE these medications which have NOT CHANGED    Details   ibuprofen (MOTRIN) 600 mg tablet Take 1 Tab by mouth every six (6) hours as needed for Pain., Print, Disp-20 Tab, R-0      HYDROcodone-acetaminophen (NORCO) 5-325 mg per tablet Take 1 Tab by mouth every six (6) hours as needed for Pain. Max Daily Amount: 4 Tabs., Print, Disp-10 Tab, R-0           3.   Follow-up Information     Follow up With Specialties Details Why [place] [facility]    Tretha Sciara, MD Neurology  As needed [address]  [place] [address]  [phone]          _______________________________    This note was partially transcribed via voice recognition software. Although efforts have been made to catch any discrepancies, it may contain sound alike words, grammatical errors, or  nonsensical words.

## 2019-12-30 LAB — EKG, 12 LEAD, INITIAL
Atrial Rate: 64 {beats}/min
Calculated P Axis: 36 degrees
Calculated R Axis: 43 degrees
Calculated T Axis: 37 degrees
P-R Interval: 136 ms
Q-T Interval: 408 ms
QRS Duration: 88 ms
QTC Calculation (Bezet): 420 ms
Ventricular Rate: 64 {beats}/min

## 2019-12-30 LAB — EKG 12-LEAD
Atrial Rate: 64 {beats}/min
P Axis: 36 degrees
P-R Interval: 136 ms
Q-T Interval: 408 ms
QRS Duration: 88 ms
QTc Calculation (Bazett): 420 ms
R Axis: 43 degrees
T Axis: 37 degrees
Ventricular Rate: 64 {beats}/min

## 2020-12-22 ENCOUNTER — Emergency Department: Admit: 2020-12-22

## 2020-12-22 ENCOUNTER — Inpatient Hospital Stay
Admit: 2020-12-22 | Discharge: 2020-12-22 | Disposition: A | Discharge status: Home / Self Care | Attending: Emergency Medicine

## 2020-12-22 DIAGNOSIS — L03113 Cellulitis of right upper limb: Secondary | ICD-10-CM

## 2020-12-22 LAB — TROPONIN, HIGH SENSITIVITY: Troponin, High Sensitivity: 6 ng/L (ref 0–54)

## 2020-12-22 LAB — TROPONIN-HIGH SENSITIVITY: Troponin-High Sensitivity: 6 ng/L (ref 0–54)

## 2020-12-22 MED ORDER — METHYLPREDNISOLONE (PF) 125 MG/2 ML IJ SOLR
125 mg/2 mL | INTRAMUSCULAR | Status: AC
Start: 2020-12-22 — End: 2020-12-22
  Administered 2020-12-22: 20:00:00 via INTRAMUSCULAR

## 2020-12-22 MED ORDER — NAPROXEN 500 MG TAB
500 mg | ORAL_TABLET | Freq: Two times a day (BID) | ORAL | 0 refills | Status: AC
Start: 2020-12-22 — End: 2020-12-29

## 2020-12-22 MED ORDER — DIPHENHYDRAMINE 25 MG CAP
25 mg | ORAL_CAPSULE | ORAL | 0 refills | Status: AC
Start: 2020-12-22 — End: ?

## 2020-12-22 MED ORDER — DIPHENHYDRAMINE 25 MG CAP
25 mg | ORAL | Status: AC
Start: 2020-12-22 — End: 2020-12-22
  Administered 2020-12-22: 20:00:00 via ORAL

## 2020-12-22 MED ORDER — CEPHALEXIN 500 MG CAP
500 mg | ORAL_CAPSULE | Freq: Four times a day (QID) | ORAL | 0 refills | Status: AC
Start: 2020-12-22 — End: 2020-12-29

## 2020-12-22 MED ORDER — DIPHENHYDRAMINE 25 MG CAP
25 mg | ORAL | Status: DC
Start: 2020-12-22 — End: 2020-12-22

## 2020-12-22 MED FILL — ALLERGY (DIPHENHYDRAMINE) 25 MG CAPSULE: 25 mg | ORAL | Qty: 1

## 2020-12-22 MED FILL — SOLU-MEDROL (PF) 125 MG/2 ML SOLUTION FOR INJECTION: 125 mg/2 mL | INTRAMUSCULAR | Qty: 2

## 2020-12-22 NOTE — ED Provider Notes (Signed)
ED Provider Notes by Nolon Bussing, PA-C at 12/22/20 1539                Author: Nolon Bussing, PA-C  Service: Emergency Medicine  Author Type: Physician Assistant       Filed: 12/22/20 1710  Date of Service: 12/22/20 1539  Status: Attested Addendum          Editor: Gifford Shave (Physician Assistant)       Related Notes: Original Note by Gifford Shave (Physician Assistant) filed at 12/22/20  1706          Cosigner: [place] Service, MD at 12/24/20 1706          Attestation signed by Boston Service, MD at 12/24/20 1706          5:06 PM   I was personally available for consultation in the emergency department. I personally did not see the patient.      Boston Service, MD                                          Ms. Sharon Davidson is a 30 year old female with past medical history of hypertension and a stroke when she was 30 years old.  She got a tattoo about a month ago and has been having pruritus and discharge from the 1 on her throat and her right upper arm.  In addition  she said she feels like she is having hard time swallowing and she is also having chest pain.  She said that she is got tattoos in the past and this is never happened.  In addition she denies any shortness of breath, fever, chills, nausea or emesis.   In addition, the patient is requesting HIV testing.      Nursing nurses regarding the HPI and triage nursing notes were reviewed.         No current facility-administered medications for this encounter.          Current Outpatient Medications        Medication  Sig         ?  cephALEXin (Keflex) 500 mg capsule  Take 1 Capsule by mouth four (4) times daily for 7 days.     ?  naproxen (NAPROSYN) 500 mg tablet  Take 1 Tablet by mouth two (2) times daily (with meals) for 7 days.         ?  diphenhydrAMINE (BenadryL) 25 mg capsule  Take 1-2 tabs every 6 hours as needed.             Past Medical History:        Diagnosis  Date         ?  Hypertension           ?  Stroke  [facility])             History reviewed. No pertinent surgical history.      History reviewed. No pertinent family history.        Social History          Socioeconomic History         ?  Marital status:  SINGLE              Spouse name:  Not on file         ?  Number of children:  Not on file     ?  Years of education:  Not on file     ?  Highest education level:  Not on file       Occupational History        ?  Not on file       Tobacco Use         ?  Smoking status:  Every Day     ?  Smokeless tobacco:  Never        ?  Tobacco comments:             socially       Substance and Sexual Activity         ?  Alcohol use:  Yes             Comment: oocasionally         ?  Drug use:  Yes              Types:  Marijuana             Comment: x2 weekly         ?  Sexual activity:  Not on file        Other Topics  Concern        ?  Not on file       Social History Narrative        ?  Not on file          Social Determinants of Health          Financial Resource Strain: Not on file     Food Insecurity: Not on file     Transportation Needs: Not on file     Physical Activity: Not on file     Stress: Not on file     Social Connections: Not on file     Intimate Partner Violence: Not on file       Housing Stability: Not on file             Allergies        Allergen  Reactions         ?  Sulfa (Sulfonamide Antibiotics)  Anaphylaxis           Patient's primary care provider (as noted in EPIC):  None      Review  of Systems    HENT:  Positive for sore throat and trouble swallowing .     Respiratory: Negative.      Cardiovascular:  Negative for chest pain.    Gastrointestinal: Negative.     Musculoskeletal: Negative.     Skin:  Positive for color change and wound .    Neurological: Negative.        Visit Vitals      BP  (!) 135/108 (BP 1 Location: Left upper arm, BP Patient Position: At rest)     Pulse  75     Temp  98.8 ??F (37.1 ??C)     Resp  18     Ht  5' (1.524 m)     Wt  106.1 kg (234 lb)     SpO2  100%        BMI  45.70 kg/m??            PHYSICAL EXAM:      CONSTITUTIONAL:  Alert, in no apparent distress;  well developed;  well nourished.   HEAD:  Normocephalic, atraumatic.   EYES:  EOMI.  Non-icteric sclera.  Normal conjunctiva.   ENTM:  Nose:  no rhinorrhea.  Throat:  no erythema or exudate, mucous membranes moist.   NECK:  No JVD.  Supple   RESPIRATORY:  Chest clear, equal breath sounds, good air movement.   CARDIOVASCULAR:  Regular rate and rhythm.  No murmurs, rubs, or gallops.   GI:  Normal bowel sounds, abdomen soft and non-tender.  No rebound or guarding.   BACK:  Non-tender.   UPPER EXT:  Normal inspection.   LOWER EXT:  No edema, no calf tenderness.  Distal pulses intact.   NEURO:  Moves all four extremities, and grossly normal motor exam.   SKIN:  No rashes;  Normal for age.   PSYCH:  Alert and normal affect.      Area of cellulitis: Right upper arm near the shoulder and the throat area.  Both areas are erythematous with some mild edema, no warmth slight tenderness to touch and no discharge.      DIFFERENTIAL DIAGNOSES/ MEDICAL DECISION MAKING:   Urticaria, viral rash, contact dermatitis, bacterial infection, fungal/ candidal rash, or symptom of underlying disease, connective tissue disorder, vasculitis (to include ITP and TTP), versus other  etiologies or a combination of the above.      Also of note the patient refused to have her urine tested for POC urine pregnancy test.  She said she is not pregnant.      Abnormal lab results from this emergency department encounter:     Labs Reviewed       TROPONIN-HIGH SENSITIVITY           Lab values for this patient within approximately the last 12 hours:     Recent Results (from the past 12 hour(s))     TROPONIN-HIGH SENSITIVITY          Collection Time: 12/22/20  4:20 PM         Result  Value  Ref Range            Troponin-High Sensitivity  6  0 - 54 ng/L           Radiologist and cardiologist interpretations if available at time of this note:   XR NECK SOFT TISSUE      Result Date: 12/22/2020    EXAMINATION: Soft tissue neck 2 views INDICATION: Dysphagia COMPARISON: None FINDINGS: Frontal and lateral views of the neck obtained. Airways patent. No focal suspicious prevertebral soft tissue swelling. C5-C6 cervical spine degenerative changes with  ventral osteophytes. Epiglottis shadow within normal limits. No definite radiopaque foreign body.       No acute radiographic findings.       Medication(s) ordered for patient during this emergency visit encounter:     Medications       methylPREDNISolone (PF) (Solu-MEDROL) injection 125 mg (125 mg IntraMUSCular Given 12/22/20 1600)       diphenhydrAMINE (BENADRYL) capsule 25 mg (25 mg Oral Given 12/22/20 1559)                 IMPRESSION AND MEDICAL DECISION MAKING:   Based upon the patient's presentation with noted HPI and PE, along with the work up done in the emergency department, I believe that the patient is having noted cellulitis.  Will treat with antibiotics.      In addition, I had a talk with the patient that if she would like to have the HIV testing she can go to her primary care or health department as we do not do  that in the emergency room.  In addition a let her know to follow-up with her primary care about  her hypertension.      DIAGNOSIS:   1. Cellulitis.   2. Hypertension      SPECIFIC PATIENT INSTRUCTIONS FROM THE PHYSICIAN WHO TREATED YOU IN THE ER TODAY:   1. Return if any concerns or worsening of condition(s)   2. Keflex as prescribed until finished.   3. Follow up with your primary doctor in 2 days or reevaluation in the ER in 48 hours.       Patient is improved, resting quietly and comfortably.  The patient will be discharged home.       The patient was reassured that these symptoms do not appear to represent a serious or life threatening condition at this time. Warning signs of worsening condition were discussed and understood by the patient.       Based on patient's age, coexisting illness, exam, and the results of this ED evaluation, the  decision to treat as an outpatient was made. Based on the information available at time of discharge, acute pathology requiring immediate intervention was deemed  relative unlikely.       While it is impossible to completely exclude the possibility of underlying serious disease or worsening of condition, I feel the relative likelihood is extremely low. I discussed this uncertainty with the patient, who understood ED evaluation and treatment  and felt comfortable with the outpatient treatment plan.       All questions regarding care, test results, and follow up were answered. The patient is stable and appropriate to discharge. They understand that they should return to the emergency department for any new or worsening symptoms. I stressed the importance  of follow up for repeat assessment and possibly further evaluation/treatment.      Dictation disclaimer:  Please note that this dictation was completed with Dragon, the computer voice recognition software.  Quite often unanticipated grammatical, syntax, homophones, and other interpretive  errors are inadvertently transcribed by the computer software.  Please disregard these errors.  Please excuse any errors that have escaped final proofreading.         Coding Diagnoses        Clinical Impression:       1.  Cellulitis of right upper extremity         2.  Elevated blood pressure reading              Disposition        Disposition:  Home      Daisy Blossom, PA-C.

## 2020-12-22 NOTE — ED Notes (Signed)
Patient c/o possible infection to tattoos on right shoulder and anterior neck.  She states difficulty swallowing related to swelling to anterior neck.  She c/o irritation to tattoos that were performed using a red dye.  Patient states that swelling to affected areas x one month

## 2020-12-22 NOTE — ED Notes (Signed)
I have reviewed discharge instructions with the patient.  The patient verbalized understanding.   Current Discharge Medication List        START taking these medications    Details   cephALEXin (Keflex) 500 mg capsule Take 1 Capsule by mouth four (4) times daily for 7 days.  Qty: 28 Capsule, Refills: 0  Start date: 12/22/2020, End date: 12/29/2020      naproxen (NAPROSYN) 500 mg tablet Take 1 Tablet by mouth two (2) times daily (with meals) for 7 days.  Qty: 14 Tablet, Refills: 0  Start date: 12/22/2020, End date: 12/29/2020      diphenhydrAMINE (BenadryL) 25 mg capsule Take 1-2 tabs every 6 hours as needed.  Qty: 16 Capsule, Refills: 0  Start date: 12/22/2020

## 2020-12-23 LAB — EKG, 12 LEAD, INITIAL
Atrial Rate: 72 {beats}/min
Calculated P Axis: 65 degrees
Calculated R Axis: 56 degrees
Calculated T Axis: 57 degrees
Diagnosis: NORMAL
P-R Interval: 160 ms
Q-T Interval: 402 ms
QRS Duration: 98 ms
QTC Calculation (Bezet): 440 ms
Ventricular Rate: 72 {beats}/min

## 2020-12-23 LAB — EKG 12-LEAD
Atrial Rate: 72 {beats}/min
Diagnosis: NORMAL
P Axis: 65 degrees
P-R Interval: 160 ms
Q-T Interval: 402 ms
QRS Duration: 98 ms
QTc Calculation (Bazett): 440 ms
R Axis: 56 degrees
T Axis: 57 degrees
Ventricular Rate: 72 {beats}/min
